# Patient Record
Sex: Male | Born: 2000 | Race: Black or African American | Hispanic: No | Marital: Single | Smoking: Never smoker
Health system: Southern US, Community
[De-identification: ages and names within clinical notes are randomized; demographics above are authoritative.]

## PROBLEM LIST (undated history)

## (undated) DIAGNOSIS — I10 Essential (primary) hypertension: Secondary | ICD-10-CM

## (undated) HISTORY — DX: Essential (primary) hypertension: I10

---
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
## 2022-02-10 DIAGNOSIS — R7303 Prediabetes: Secondary | ICD-10-CM | POA: Insufficient documentation

## 2022-02-10 DIAGNOSIS — I10 Essential (primary) hypertension: Secondary | ICD-10-CM | POA: Insufficient documentation

## 2022-02-10 DIAGNOSIS — Z6841 Body Mass Index (BMI) 40.0 and over, adult: Secondary | ICD-10-CM | POA: Insufficient documentation

## 2022-02-10 DIAGNOSIS — R0683 Snoring: Secondary | ICD-10-CM | POA: Insufficient documentation

## 2022-04-02 ENCOUNTER — Institutional Professional Consult (permissible substitution): Payer: Medicaid Other | Admitting: Nurse Practitioner

## 2022-04-06 NOTE — Progress Notes (Unsigned)
 @  Patient ID: Vincent Kemp, male    DOB: [birth year], 21 y.o.   MRN: [id number]  No chief complaint on file.   Referring provider: York Pellant, PA-C  HPI: 21 year old male, never smoked. PMH significant HTN, pre-diabetes, snoring and obesity,  04/07/2022 Patient presents today for sleep consult.  He has symptoms of snoring. No prior sleep studies.         No Known Allergies   There is no immunization history on file for this patient.  No past medical history on file.  Tobacco History: Social History   Tobacco Use  Smoking Status Never  Smokeless Tobacco Not on file   Counseling given: Not Answered   Outpatient Medications Prior to Visit  Medication Sig Dispense Refill   azithromycin (ZITHROMAX) 250 MG tablet Take 1 tablet (250 mg total) by mouth daily. Take first 2 tablets together, then 1 every day until finished. 6 tablet 0   No facility-administered medications prior to visit.      Review of Systems  Review of Systems   Physical Exam  There were no vitals taken for this visit. Physical Exam   Lab Results:  CBC No results found for: "WBC", "RBC", "HGB", "HCT", "PLT", "MCV", "MCH", "MCHC", "RDW", "LYMPHSABS", "MONOABS", "EOSABS", "BASOSABS"  BMET No results found for: "NA", "K", "CL", "CO2", "GLUCOSE", "BUN", "CREATININE", "CALCIUM", "GFRNONAA", "GFRAA"  BNP No results found for: "BNP"  ProBNP No results found for: "PROBNP"  Imaging: No results found.   Assessment & Plan:   No problem-specific Assessment & Plan notes found for this encounter.     Vincent Ehrich, NP 04/06/2022

## 2022-04-07 ENCOUNTER — Encounter: Payer: Self-pay | Admitting: Primary Care

## 2022-04-07 ENCOUNTER — Ambulatory Visit (INDEPENDENT_AMBULATORY_CARE_PROVIDER_SITE_OTHER): Payer: Medicaid Other | Admitting: Primary Care

## 2022-04-07 VITALS — BP 132/90 | HR 95 | Ht 70.0 in | Wt 278.0 lb

## 2022-04-07 DIAGNOSIS — G4700 Insomnia, unspecified: Secondary | ICD-10-CM | POA: Diagnosis not present

## 2022-04-07 DIAGNOSIS — R0683 Snoring: Secondary | ICD-10-CM

## 2022-04-07 NOTE — Patient Instructions (Addendum)
  Sleep apnea is defined as period of 10 seconds or longer when you stop breathing at night. This can happen multiple times a night. Dx sleep apnea is when this occurs more than 5 times an hour.    Mild OSA 5-15 apneic events an hour Moderate OSA 15-30 apneic events an hour Severe OSA > 30 apneic events an hour   Untreated sleep apnea puts you at higher risk for cardiac arrhythmias, pulmonary HTN, stroke and diabetes   Treatment options include weight loss, side sleeping position, oral appliance, CPAP therapy or referral to ENT for possible surgical options    Recommendations: Focus on side sleeping position or elevate head with wedge pillow 30 degress Work on weight loss efforts if able  Do not drive if experiencing excessive daytime sleepiness of fatigue  You can try 3-5mg  Melatonin at bedtime of insomnia if needed    Orders: Home sleep study re: loud snoring (ordered)   Follow-up: Please call to schedule follow-up 1-2 weeks after completing home sleep study to review results and treatment if needed (can be virtual)

## 2022-04-07 NOTE — Assessment & Plan Note (Addendum)
-   Patient has symptoms of snoring.  Epworth 0. BMI 39. He is at risk for sleep apnea d/t obesity. Concern patient could have obstructive sleep apnea, needs home sleep study to evaluate. Discussed risk of untreated sleep apnea including cardiac arrhythmias, pulm HTN, stroke, DM. We briefly reviewed treatment options. Encouraged patient to work on weight loss efforts and focus on side sleeping position/elevate head of bed. Advised against driving if experiencing excessive daytime sleepiness. Follow-up in 4-6 weeks to review sleep study results and discuss treatment options further

## 2022-04-07 NOTE — Assessment & Plan Note (Signed)
-   Advised he can try 3-5mg  Melatonin

## 2022-04-07 NOTE — Progress Notes (Signed)
Reviewed and agree with assessment/plan.   Encarnacion Scioneaux, MD [facility] 04/07/2022, 10:39 PM Pager:  [phone]  

## 2022-07-30 ENCOUNTER — Ambulatory Visit: Payer: Medicaid Other

## 2022-07-30 DIAGNOSIS — R0683 Snoring: Secondary | ICD-10-CM

## 2022-07-30 DIAGNOSIS — G4733 Obstructive sleep apnea (adult) (pediatric): Secondary | ICD-10-CM

## 2022-08-12 ENCOUNTER — Telehealth: Payer: Self-pay | Admitting: Primary Care

## 2022-08-12 NOTE — Telephone Encounter (Signed)
Patient had sleep study on 07/30/22, doesn't look like results have been read. Can we follow up on this- thanks

## 2022-08-13 ENCOUNTER — Ambulatory Visit: Payer: Medicaid Other | Admitting: Primary Care

## 2022-08-13 NOTE — Telephone Encounter (Signed)
Beth are you able to look at his results or does this need to wait until Dr. Ander Slade returns on the 29th? Please advise.

## 2022-08-13 NOTE — Telephone Encounter (Signed)
We have the results for sleep study we are just waiting for Dr Jenetta Downer to return to clinic on 1/29. Patient has follow up with Beth on 2/1. We will make sure Eustaquio Maize has results of sleep study for patient follow up. Nothing further needed

## 2022-08-13 NOTE — Telephone Encounter (Signed)
I mean he is back before the next appt so it should be fine to wait

## 2022-08-13 NOTE — Telephone Encounter (Signed)
This was put under Dr Ander Slade and placed in his look at he is here on the 29th I sent him a message on 07/31/2022

## 2022-08-13 NOTE — Telephone Encounter (Signed)
I don't have results, they have not been read by sleep doctor. I have forwarded results to [facility] pool to follow-up on this. He was been scheduled for visit on 08/21/22 with me.

## 2022-08-19 ENCOUNTER — Telehealth: Payer: Self-pay | Admitting: Pulmonary Disease

## 2022-08-19 DIAGNOSIS — G4733 Obstructive sleep apnea (adult) (pediatric): Secondary | ICD-10-CM | POA: Diagnosis not present

## 2022-08-19 NOTE — Telephone Encounter (Signed)
Call patient  Sleep study result  Date of study: 07/31/2022  Impression: Mild obstructive sleep apnea Moderate oxygen desaturations  Recommendation: Options of treatment for mild obstructive sleep apnea will include  1.  CPAP therapy if there is significant daytime sleepiness or other comorbidities including history of CVA or cardiac disease  -If CPAP is chosen as an option of treatment auto titrating CPAP with a pressure setting of 5-15 will be appropriate  2.  Watchful waiting with emphasis on weight loss measures, sleep position modification to optimize lateral sleep, elevating the head of the bed by about 30 degrees may also help.  3.  An oral device may be fashioned for the treatment of mild sleep disordered breathing, will involve referral to dentist.  Follow-up as previously scheduled

## 2022-08-20 NOTE — Telephone Encounter (Signed)
Pt scheduled for ov 08/21/22 to review results.

## 2022-08-20 NOTE — Progress Notes (Unsigned)
@  Patient ID: Vincent Kemp, male    DOB: 2000/10/06, 22 y.o.   MRN: [id number]  No chief complaint on file.   Referring provider: No ref. provider found  HPI:  22 year old male, never smoked. PMH significant HTN, pre-diabetes, snoring and obesity.  04/07/2022 Patient presents today for sleep consult. Accompanied by his mother and sister.  He has symptoms of loud snoring and difficulty falling asleep. No prior sleep studies. He mainly sleeps on his side but still snores. Typical bedtime is 10 PM. It can take him up to 60 minutes to fall asleep.  Insomnia symptoms do not particularly bother him or impact this quality of life. He has no nocturnal awakenings. He starts his day at 10 AM.  No associated daytime sleepiness.  His weight is up 50 pounds.  He snored less prior to weight gain.  Epworth score is 0.  Denies symptoms of narcolepsy, cataplexy or sleepwalking.  Sleep questionnaire Symptoms- Loud snoring and insomnia  Prior sleep study- None Bedtime- 10pm Time to fall asleep- 63min  Nocturnal awakenings- none Out of bed/start of day- 10am Weight changes- up 50 lbs Do you operate heavy machinery- No Do you currently wear CPAP- No Do you current wear oxygen- No Epworth- 0  08/21/2022 - Interim hx  Patient presents today to review sleep study results  HST 07/31/21 showed mild OS, AHI 14.2       No Known Allergies   There is no immunization history on file for this patient.  Past Medical History:  Diagnosis Date   Hypertension     Tobacco History: Social History   Tobacco Use  Smoking Status Never  Smokeless Tobacco Not on file   Counseling given: Not Answered   Outpatient Medications Prior to Visit  Medication Sig Dispense Refill   amLODipine (NORVASC) 5 MG tablet Take by mouth.     azithromycin (ZITHROMAX) 250 MG tablet Take 1 tablet (250 mg total) by mouth daily. Take first 2 tablets together, then 1 every day until finished. 6 tablet 0   No  facility-administered medications prior to visit.      Review of Systems  Review of Systems   Physical Exam  There were no vitals taken for this visit. Physical Exam   Lab Results:  CBC No results found for: "WBC", "RBC", "HGB", "HCT", "PLT", "MCV", "MCH", "MCHC", "RDW", "LYMPHSABS", "MONOABS", "EOSABS", "BASOSABS"  BMET No results found for: "NA", "K", "CL", "CO2", "GLUCOSE", "BUN", "CREATININE", "CALCIUM", "GFRNONAA", "GFRAA"  BNP No results found for: "BNP"  ProBNP No results found for: "PROBNP"  Imaging: No results found.   Assessment & Plan:   No problem-specific Assessment & Plan notes found for this encounter.     Martyn Ehrich, NP 08/20/2022

## 2022-08-21 ENCOUNTER — Ambulatory Visit (INDEPENDENT_AMBULATORY_CARE_PROVIDER_SITE_OTHER): Payer: Medicaid Other | Admitting: Primary Care

## 2022-08-21 ENCOUNTER — Encounter: Payer: Self-pay | Admitting: Primary Care

## 2022-08-21 VITALS — BP 128/84 | HR 99 | Temp 98.5°F | Ht 70.0 in | Wt 283.0 lb

## 2022-08-21 DIAGNOSIS — Z6841 Body Mass Index (BMI) 40.0 and over, adult: Secondary | ICD-10-CM

## 2022-08-21 DIAGNOSIS — G4730 Sleep apnea, unspecified: Secondary | ICD-10-CM

## 2022-08-21 NOTE — Patient Instructions (Addendum)
Sleep study on 07/31/2021 showed mild obstructive sleep apnea, you had on average 14 apneas an hour  Treatment options include weight loss, oral appliance, CPAP therapy or referral to ENT for possible surgical options  You have elected to work on weight loss.  Goal weight for you would be between 230-250lb   Avoid alcohol use prior to bedtime as this can worsen underlying sleep apnea  Do not drive experiencing excessive daytime sleepiness or fatigue  Avoid directly sleeping on your back  Focus on side sleeping position.    Referral  [facility]  Follow-up 6 months with Vincent Maize NP

## 2022-08-21 NOTE — Assessment & Plan Note (Addendum)
-  Patient has symptoms of loud snoring.  Home sleep study on 07/31/2022 showed mild obstructive sleep apnea, AHI 14.2 an hour.  Reviewed sleep study results, risks of untreated sleep apnea and treatment options.  Patient is asymptomatic without significant daytime sleepiness.  He is elected to conservatively manage with weight loss efforts.  We will refer him to medical weight management.  Advised patient avoid directly sleeping on his back and focus on side sleeping position.  Avoid driving if experiencing excessive daytime sleepiness fatigue.  Follow-up in 6 months, weight loss is unsuccessful may consider at that time oral appliance or CPAP.Marland Kitchen

## 2022-08-22 NOTE — Progress Notes (Signed)
Reviewed and agree with assessment/plan.   Niaya Hickok, MD [place] [facility] 08/22/2022, 8:44 AM Pager:  [phone]  

## 2023-02-19 ENCOUNTER — Ambulatory Visit: Payer: Medicaid Other | Admitting: Primary Care

## 2023-04-07 ENCOUNTER — Encounter: Payer: Self-pay | Admitting: Primary Care

## 2023-06-12 ENCOUNTER — Telehealth: Payer: Medicaid Other | Admitting: Primary Care

## 2023-06-12 DIAGNOSIS — K219 Gastro-esophageal reflux disease without esophagitis: Secondary | ICD-10-CM | POA: Diagnosis not present

## 2023-06-12 DIAGNOSIS — G4733 Obstructive sleep apnea (adult) (pediatric): Secondary | ICD-10-CM | POA: Diagnosis not present

## 2023-06-12 DIAGNOSIS — G4730 Sleep apnea, unspecified: Secondary | ICD-10-CM

## 2023-06-12 DIAGNOSIS — Z6841 Body Mass Index (BMI) 40.0 and over, adult: Secondary | ICD-10-CM

## 2023-06-12 MED ORDER — OMEPRAZOLE 20 MG PO CPDR: 20.0000 mg | DELAYED_RELEASE_CAPSULE | Freq: Two times a day (BID) | ORAL | 1 refills | Status: DC

## 2023-06-12 NOTE — Patient Instructions (Addendum)
Recommendations: Omeprazole 20mg  twice daily before meals for reflux symptoms  Focus on side sleeping position Encourage you work on weight loss  Call [facility] with [facility] Call [phone]   Rx: Omeprazole 20mg  BID x 4-6   Follow-up: 6 months with Beth NP   Food Choices for Gastroesophageal Reflux Disease, Adult When you have gastroesophageal reflux disease (GERD), the foods you eat and your eating habits are very important. Choosing the right foods can help ease your discomfort. Think about working with a food expert (dietitian) to help you make good choices. What are tips for following this plan? Reading food labels Look for foods that are low in saturated fat. Foods that may help with your symptoms include: Foods that have less than 5% of daily value (DV) of fat. Foods that have 0 grams of trans fat. Cooking Do not fry your food. Cook your food by baking, steaming, grilling, or broiling. These are all methods that do not need a lot of fat for cooking. To add flavor, try to use herbs that are low in spice and acidity. Meal planning  Choose healthy foods that are low in fat, such as: Fruits and vegetables. Whole grains. Low-fat dairy products. Lean meats, fish, and poultry. Eat small meals often instead of eating 3 large meals each day. Eat your meals slowly in a place where you are relaxed. Avoid bending over or lying down until 2-3 hours after eating. Limit high-fat foods such as fatty meats or fried foods. Limit your intake of fatty foods, such as oils, butter, and shortening. Avoid the following as told by your doctor: Foods that cause symptoms. These may be different for different people. Keep a food diary to keep track of foods that cause symptoms. Alcohol. Drinking a lot of liquid with meals. Eating meals during the 2-3 hours before bed. Lifestyle Stay at a healthy weight. Ask your doctor what weight is healthy for you. If you need to  lose weight, work with your doctor to do so safely. Exercise for at least 30 minutes on 5 or more days each week, or as told by your doctor. Wear loose-fitting clothes. Do not smoke or use any products that contain nicotine or tobacco. If you need help quitting, ask your doctor. Sleep with the head of your bed higher than your feet. Use a wedge under the mattress or blocks under the bed frame to raise the head of the bed. Chew sugar-free gum after meals. What foods should eat?  Eat a healthy, well-balanced diet of fruits, vegetables, whole grains, low-fat dairy products, lean meats, fish, and poultry. Each person is different. Foods that may cause symptoms in one person may not cause any symptoms in another person. Work with your doctor to find foods that are safe for you. The items listed above may not be a complete list of what you can eat and drink. Contact a food expert for more options. What foods should I avoid? Limiting some of these foods may help in managing the symptoms of GERD. Everyone is different. Talk with a food expert or your doctor to help you find the exact foods to avoid, if any. Fruits Any fruits prepared with added fat. Any fruits that cause symptoms. For some people, this may include citrus fruits, such as oranges, grapefruit, pineapple, and lemons. Vegetables Deep-fried vegetables. [place] fries. Any vegetables prepared with added fat. Any vegetables that cause symptoms. For some people, this may include tomatoes and tomato products, chili  peppers, onions and garlic, and horseradish. Grains Pastries or quick breads with added fat. Meats and other proteins High-fat meats, such as fatty beef or pork, hot dogs, ribs, ham, sausage, salami, and bacon. Fried meat or protein, including fried fish and fried chicken. Nuts and nut butters, in large amounts. Dairy Whole milk and chocolate milk. Sour cream. Cream. Ice cream. Cream cheese. Milkshakes. Fats and oils Butter.  Margarine. Shortening. Ghee. Beverages Coffee and tea, with or without caffeine. Carbonated beverages. Sodas. Energy drinks. Fruit juice made with acidic fruits, such as orange or grapefruit. Tomato juice. Alcoholic drinks. Sweets and desserts Chocolate and cocoa. Donuts. Seasonings and condiments Pepper. Peppermint and spearmint. Added salt. Any condiments, herbs, or seasonings that cause symptoms. For some people, this may include curry, hot sauce, or vinegar-based salad dressings. The items listed above may not be a complete list of what you should not eat and drink. Contact a food expert for more options. Questions to ask your doctor Diet and lifestyle changes are often the first steps that are taken to manage symptoms of GERD. If diet and lifestyle changes do not help, talk with your doctor about taking medicines. Where to find more information [employer] for Gastrointestinal [employer]: aboutgerd.org Summary When you have GERD, food and lifestyle choices are very important in easing your symptoms. Eat small meals often instead of 3 large meals a day. Eat your meals slowly and in a place where you are relaxed. Avoid bending over or lying down until 2-3 hours after eating. Limit high-fat foods such as fatty meats or fried foods. This information is not intended to replace advice given to you by your health care provider. Make sure you discuss any questions you have with your health care provider. Document Revised: 01/16/2020 Document Reviewed: 01/16/2020 Elsevier Patient Education  2024 [employer].

## 2023-06-12 NOTE — Progress Notes (Signed)
Virtual Visit via Video Note  I connected with Vincent Kemp on 06/12/23 at  1:30 PM EST by a video enabled telemedicine application and verified that I am speaking with the correct person using two identifiers.  Location: Patient: Home Provider: Office    I discussed the limitations of evaluation and management by telemedicine and the availability of in person appointments. The patient expressed understanding and agreed to proceed.  History of Present Illness: 22 year old male, never smoked. PMH significant HTN, pre-diabetes, snoring and obesity.  Previous [facility] pulmonary encounter: 04/07/2022 Patient presents today for sleep consult. Accompanied by his mother and sister.  He has symptoms of loud snoring and difficulty falling asleep. No prior sleep studies. He mainly sleeps on his side but still snores. Typical bedtime is 10 PM. It can take him up to 60 minutes to fall asleep.  Insomnia symptoms do not particularly bother him or impact this quality of life. He has no nocturnal awakenings. He starts his day at 10 AM.  No associated daytime sleepiness.  His weight is up 50 pounds.  He snored less prior to weight gain.  Epworth score is 0.  Denies symptoms of narcolepsy, cataplexy or sleepwalking.  Sleep questionnaire Symptoms- Loud snoring and insomnia  Prior sleep study- None Bedtime- 10pm Time to fall asleep-  Nocturnal awakenings- none Out of bed/start of day- 10am Weight changes- up 50 lbs Do you operate heavy machinery- No Do you currently wear CPAP- No Do you current wear oxygen- No Epworth- 0  08/21/2022  Patient presents today to review sleep study results  Patient has symptoms of snoring and insomnia He sleeps on his stomach mainly  It can take him an hour to fall asleep, this does not impact his life He has no significant daytime sleepiness. Epworth score 9.  HST 07/31/22 showed mild OSA, AHI 14.2/hr  Reviewed sleep study results, risks of untreated sleep apnea and  treatment options  He would prefer to work on weight loss, going to the gym 3-4 times a week by walking treadmil and lifting weights Goal weight 230-250lbs   06/12/2023- Interim hx  Discussed the use of AI scribe software for clinical note transcription with the patient, who gave verbal consent to proceed.  History of Present Illness   The patient, previously diagnosed with mild sleep apnea, presented for a follow-up consultation. The initial consultation occurred in September 2023 due to concerns about snoring and difficulty falling asleep. A sleep study conducted in January 2024 revealed an average of 14 apneic events per hour, placing the patient in the mild to moderate sleep apnea category. At that time, the patient elected to focus on weight management as a primary intervention due to the absence of significant daytime sleepiness.  Over the past six months, the patient reported an improvement in sleep quality, attributing this to dietary changes and increased physical activity. He reported sleeping through most of the night, with no episodes of waking up gasping or choking. However, the patient has been struggling with weight loss and has not yet connected with the recommended [facility] program.  The patient also reported a new symptom of chest discomfort after eating, particularly when lying down. This discomfort was suggestive of gastroesophageal reflux disease (GERD). The patient expressed a willingness to try medication to manage these symptoms.      Observations/Objective:  Appears well without overt respiratory symptoms   Assessment and Plan:     Obstructive Sleep Apnea Patient has symptoms of  loud snoring.  Home sleep study on 07/31/2022 showed mild obstructive sleep apnea, AHI 14.2/hour with SpO2 low 77% (baseline 94%).  Reviewed sleep study results, risks of untreated sleep apnea and treatment options.  Patient is asymptomatic without significant daytime  sleepiness. Patient has been working on [employer] and reports improved sleep.  -Continue weight management efforts. -Refer to [employer] [facility] in [place] for further weight loss support. -Consider oral appliance or CPAP if no significant changes in 6-12 months.  Gastroesophageal Reflux Disease (GERD) Reports chest discomfort after eating and when lying down post-meal.  -Start Omeprazole twice daily before meals for 4-6 weeks. -Educate on lifestyle modifications including not lying down immediately after eating. -Provide educational material on GERD via MyChart.     Follow Up Instructions:  -Follow-up in 6 months to reassess sleep apnea and weight management progress.   I discussed the assessment and treatment plan with the patient. The patient was provided an opportunity to ask questions and all were answered. The patient agreed with the plan and demonstrated an understanding of the instructions.   The patient was advised to call back or seek an in-person evaluation if the symptoms worsen or if the condition fails to improve as anticipated.  I provided 22 minutes of non-face-to-face time during this encounter.   Vincent Bayley, NP

## 2023-07-27 ENCOUNTER — Encounter: Payer: Medicaid Other | Admitting: Family Medicine

## 2023-09-20 ENCOUNTER — Other Ambulatory Visit: Payer: Self-pay | Admitting: Primary Care

## 2023-12-12 ENCOUNTER — Other Ambulatory Visit: Payer: Self-pay | Admitting: Primary Care

## 2023-12-15 ENCOUNTER — Telehealth: Payer: Self-pay | Admitting: *Deleted

## 2023-12-15 NOTE — Telephone Encounter (Signed)
 Spoke with Eulas Hick NP, she states to provide prescription of Omeprazole  with no refills.  She stated that if he has had improvement on the omeprazole  to decrease to 1 capsule per day.  VM left for patient, advised to either call the office or send a mychart message letting us  know how he is doing on the Omeprazole .

## 2024-01-13 ENCOUNTER — Other Ambulatory Visit: Payer: Self-pay | Admitting: Primary Care

## 2024-01-13 ENCOUNTER — Encounter: Payer: Self-pay | Admitting: *Deleted

## 2024-01-18 NOTE — Telephone Encounter (Signed)
 Beth,  See patient message and advise.  Thank you.

## 2024-01-19 NOTE — Telephone Encounter (Signed)
 Most reflux medication can cause possible side effect. If intolerable we could try switching to Famotidine 40mg  at bedtime

## 2024-02-01 ENCOUNTER — Other Ambulatory Visit: Payer: Self-pay

## 2024-02-01 MED ORDER — FAMOTIDINE 20 MG PO TABS: 40.0000 mg | ORAL_TABLET | Freq: Every day | ORAL | 0 refills | Status: DC

## 2024-02-01 MED ORDER — FAMOTIDINE 20 MG PO TABS: 40.0000 mg | ORAL_TABLET | Freq: Two times a day (BID) | ORAL | 3 refills | Status: DC

## 2024-02-01 NOTE — Telephone Encounter (Signed)
 Famotidine  has been sent to pharmacy.

## 2024-03-02 ENCOUNTER — Other Ambulatory Visit: Payer: Self-pay | Admitting: Primary Care

## 2024-06-28 ENCOUNTER — Other Ambulatory Visit: Payer: Self-pay | Admitting: Primary Care
